# Patient Record
Sex: Male | Born: 1935 | Race: White | Hispanic: No | Marital: Single | Smoking: Former smoker
Health system: Southern US, Community
[De-identification: ages and names within clinical notes are randomized; demographics above are authoritative.]

## PROBLEM LIST (undated history)

## (undated) DIAGNOSIS — I2510 Atherosclerotic heart disease of native coronary artery without angina pectoris: Secondary | ICD-10-CM

## (undated) DIAGNOSIS — K56609 Unspecified intestinal obstruction, unspecified as to partial versus complete obstruction: Secondary | ICD-10-CM

## (undated) DIAGNOSIS — C801 Malignant (primary) neoplasm, unspecified: Secondary | ICD-10-CM

## (undated) DIAGNOSIS — I10 Essential (primary) hypertension: Secondary | ICD-10-CM

## (undated) DIAGNOSIS — J449 Chronic obstructive pulmonary disease, unspecified: Secondary | ICD-10-CM

## (undated) HISTORY — PX: HERNIA REPAIR: SHX51

## (undated) HISTORY — PX: COLOSTOMY: SHX63

## (undated) HISTORY — PX: CORONARY ANGIOPLASTY WITH STENT PLACEMENT: SHX49

## (undated) HISTORY — PX: CARDIAC SURGERY: SHX584

## (undated) HISTORY — PX: CHOLECYSTECTOMY: SHX55

---

## 2015-01-10 ENCOUNTER — Emergency Department (HOSPITAL_COMMUNITY)
Admission: EM | Admit: 2015-01-10 | Discharge: 2015-01-10 | Disposition: A | Discharge status: Home / Self Care | Payer: Medicare Other | Attending: Emergency Medicine | Admitting: Emergency Medicine

## 2015-01-10 ENCOUNTER — Emergency Department (HOSPITAL_COMMUNITY): Payer: Medicare Other

## 2015-01-10 ENCOUNTER — Encounter (HOSPITAL_COMMUNITY): Payer: Self-pay | Admitting: Emergency Medicine

## 2015-01-10 DIAGNOSIS — R112 Nausea with vomiting, unspecified: Secondary | ICD-10-CM | POA: Diagnosis present

## 2015-01-10 DIAGNOSIS — I2510 Atherosclerotic heart disease of native coronary artery without angina pectoris: Secondary | ICD-10-CM | POA: Insufficient documentation

## 2015-01-10 DIAGNOSIS — Z79899 Other long term (current) drug therapy: Secondary | ICD-10-CM | POA: Diagnosis not present

## 2015-01-10 DIAGNOSIS — Z7982 Long term (current) use of aspirin: Secondary | ICD-10-CM | POA: Diagnosis not present

## 2015-01-10 DIAGNOSIS — Z8719 Personal history of other diseases of the digestive system: Secondary | ICD-10-CM | POA: Insufficient documentation

## 2015-01-10 DIAGNOSIS — I10 Essential (primary) hypertension: Secondary | ICD-10-CM | POA: Insufficient documentation

## 2015-01-10 DIAGNOSIS — Z87891 Personal history of nicotine dependence: Secondary | ICD-10-CM | POA: Insufficient documentation

## 2015-01-10 DIAGNOSIS — R42 Dizziness and giddiness: Secondary | ICD-10-CM

## 2015-01-10 DIAGNOSIS — J449 Chronic obstructive pulmonary disease, unspecified: Secondary | ICD-10-CM | POA: Diagnosis not present

## 2015-01-10 DIAGNOSIS — Z955 Presence of coronary angioplasty implant and graft: Secondary | ICD-10-CM | POA: Insufficient documentation

## 2015-01-10 DIAGNOSIS — Z8551 Personal history of malignant neoplasm of bladder: Secondary | ICD-10-CM | POA: Insufficient documentation

## 2015-01-10 HISTORY — DX: Atherosclerotic heart disease of native coronary artery without angina pectoris: I25.10

## 2015-01-10 HISTORY — DX: Unspecified intestinal obstruction, unspecified as to partial versus complete obstruction: K56.609

## 2015-01-10 HISTORY — DX: Essential (primary) hypertension: I10

## 2015-01-10 HISTORY — DX: Malignant (primary) neoplasm, unspecified: C80.1

## 2015-01-10 HISTORY — DX: Chronic obstructive pulmonary disease, unspecified: J44.9

## 2015-01-10 LAB — CBC WITH DIFFERENTIAL/PLATELET
Basophils Absolute: 0 10*3/uL (ref 0.0–0.1)
Basophils Relative: 0 % (ref 0–1)
EOS ABS: 0.2 10*3/uL (ref 0.0–0.7)
Eosinophils Relative: 2 % (ref 0–5)
HCT: 45.1 % (ref 39.0–52.0)
Hemoglobin: 15.2 g/dL (ref 13.0–17.0)
LYMPHS ABS: 1.1 10*3/uL (ref 0.7–4.0)
LYMPHS PCT: 10 % — AB (ref 12–46)
MCH: 29.3 pg (ref 26.0–34.0)
MCHC: 33.7 g/dL (ref 30.0–36.0)
MCV: 87.1 fL (ref 78.0–100.0)
MONO ABS: 0.4 10*3/uL (ref 0.1–1.0)
MONOS PCT: 4 % (ref 3–12)
NEUTROS PCT: 84 % — AB (ref 43–77)
Neutro Abs: 9.7 10*3/uL — ABNORMAL HIGH (ref 1.7–7.7)
PLATELETS: 181 10*3/uL (ref 150–400)
RBC: 5.18 MIL/uL (ref 4.22–5.81)
RDW: 13.5 % (ref 11.5–15.5)
WBC: 11.5 10*3/uL — AB (ref 4.0–10.5)

## 2015-01-10 LAB — LIPASE, BLOOD: LIPASE: 32 U/L (ref 11–59)

## 2015-01-10 LAB — COMPREHENSIVE METABOLIC PANEL
ALT: 20 U/L (ref 0–53)
AST: 29 U/L (ref 0–37)
Albumin: 4.1 g/dL (ref 3.5–5.2)
Alkaline Phosphatase: 121 U/L — ABNORMAL HIGH (ref 39–117)
Anion gap: 9 (ref 5–15)
BILIRUBIN TOTAL: 0.8 mg/dL (ref 0.3–1.2)
BUN: 16 mg/dL (ref 6–23)
CALCIUM: 8.8 mg/dL (ref 8.4–10.5)
CHLORIDE: 105 mmol/L (ref 96–112)
CO2: 23 mmol/L (ref 19–32)
CREATININE: 1.03 mg/dL (ref 0.50–1.35)
GFR calc non Af Amer: 68 mL/min — ABNORMAL LOW (ref >90)
GFR, EST AFRICAN AMERICAN: 79 mL/min — AB (ref >90)
Glucose, Bld: 139 mg/dL — ABNORMAL HIGH (ref 70–99)
Potassium: 3.8 mmol/L (ref 3.5–5.1)
Sodium: 137 mmol/L (ref 135–145)
Total Protein: 6.9 g/dL (ref 6.0–8.3)

## 2015-01-10 LAB — I-STAT CG4 LACTIC ACID, ED: LACTIC ACID, VENOUS: 1.29 mmol/L (ref 0.5–2.0)

## 2015-01-10 LAB — I-STAT TROPONIN, ED: TROPONIN I, POC: 0 ng/mL (ref 0.00–0.08)

## 2015-01-10 MED ORDER — SODIUM CHLORIDE 0.9 % IV SOLN
1000.0000 mL | INTRAVENOUS | Status: DC
  Administered 2015-01-10: 1000 mL via INTRAVENOUS

## 2015-01-10 MED ORDER — LORAZEPAM 1 MG PO TABS: 1.0000 mg | ORAL_TABLET | Freq: Once | ORAL | Status: DC

## 2015-01-10 MED ORDER — ONDANSETRON 8 MG PO TBDP: ORAL_TABLET | ORAL | Status: AC

## 2015-01-10 MED ORDER — ONDANSETRON HCL 4 MG/2ML IJ SOLN
4.0000 mg | Freq: Once | INTRAMUSCULAR | Status: AC
  Administered 2015-01-10: 4 mg via INTRAVENOUS
  Filled 2015-01-10: qty 2

## 2015-01-10 NOTE — ED Notes (Signed)
Pt tolerating po fluids well, able to ambulate without difficulty

## 2015-01-10 NOTE — ED Provider Notes (Signed)
CSN: [id number]     Arrival date & time 01/10/15  1956 History   First MD Initiated Contact with Patient 01/10/15 1958     Chief Complaint  Patient presents with  . Nausea     (Consider location/radiation/quality/duration/timing/severity/associated sxs/prior Treatment) The history is provided by the patient and medical records. No language interpreter was used.     Christopher Valentine is a 79 y.o. male  with a hx of HTN, vertigo, CAD presents to the Emergency Department complaining of intermittent dizziness onset 3 days ago with associated nausea and vomiting. Pt reports he usually vomits when he has vertigo symptoms, but it became extreme around 4pm with persistent vomiting and nausea since that time.  Pt denies fever, chills, abd pain, diarrhea, weakness.  He also denies headache, neck pain, chest pain, SOB, diarrhea, syncope, dysuria.  He reports using scopolamine patches and is wearing one now which has helped minimally.   Nothing seems to make his symptoms worse.  Pt reports he just moved from [place] and had a neg stress test and echo before moving.     Past Medical History  Diagnosis Date  . Hypertension   . Coronary artery disease   . Bowel obstruction     with colostomy and subsequent reanastamosis  . Cancer     bladder  . COPD (chronic obstructive pulmonary disease)    Past Surgical History  Procedure Laterality Date  . Cardiac surgery  cabg x 2  . Coronary angioplasty with stent placement    . Cholecystectomy    . Hernia repair    . Colostomy      with later reversal   History reviewed. No pertinent family history. History  Substance Use Topics  . Smoking status: Former Smoker -- 1.00 packs/day for 30 years    Types: Cigarettes  . Smokeless tobacco: Not on file  . Alcohol Use: No    Review of Systems  Constitutional: Negative for fever, diaphoresis, appetite change, fatigue and unexpected weight change.  HENT: Negative for mouth sores.   Eyes: Negative for visual  disturbance.  Respiratory: Negative for cough, chest tightness, shortness of breath and wheezing.   Cardiovascular: Negative for chest pain.  Gastrointestinal: Positive for nausea and vomiting. Negative for abdominal pain, diarrhea and constipation.  Endocrine: Negative for polydipsia, polyphagia and polyuria.  Genitourinary: Negative for dysuria, urgency, frequency and hematuria.  Musculoskeletal: Negative for back pain and neck stiffness.  Skin: Negative for rash.  Allergic/Immunologic: Negative for immunocompromised state.  Neurological: Positive for dizziness. Negative for syncope, light-headedness and headaches.  Hematological: Does not bruise/bleed easily.  Psychiatric/Behavioral: Negative for sleep disturbance. The patient is not nervous/anxious.       Allergies  Contrast media  Home Medications   Prior to Admission medications   Medication Sig Start Date End Date Taking? Authorizing Provider  albuterol (PROVENTIL HFA;VENTOLIN HFA) 108 (90 BASE) MCG/ACT inhaler Inhale 1 puff into the lungs every 6 (six) hours as needed for wheezing or shortness of breath.   Yes Historical Provider, MD  amLODipine (NORVASC) 10 MG tablet Take 5 mg by mouth daily.   Yes Historical Provider, MD  aspirin 81 MG chewable tablet Chew 81 mg by mouth daily.   Yes Historical Provider, MD  carvedilol (COREG) 12.5 MG tablet Take 12.5 mg by mouth 2 (two) times daily with a meal.   Yes Historical Provider, MD  docusate sodium (COLACE) 100 MG capsule Take 100 mg by mouth at bedtime.   Yes Historical Provider, MD  finasteride (PROSCAR) 5 MG tablet Take 5 mg by mouth daily.   Yes Historical Provider, MD  losartan (COZAAR) 100 MG tablet Take 100 mg by mouth daily.   Yes Historical Provider, MD  meclizine (ANTIVERT) 25 MG tablet Take 25 mg by mouth 3 (three) times daily.   Yes Historical Provider, MD  ondansetron (ZOFRAN ODT) 8 MG disintegrating tablet 8mg  ODT q4 hours prn nausea 01/10/15   Jarrett Soho Quisha Mabie, PA-C   pantoprazole (PROTONIX) 40 MG tablet Take 40 mg by mouth 2 (two) times daily before a meal.   Yes Historical Provider, MD  PARoxetine (PAXIL) 30 MG tablet Take 15 mg by mouth daily.   Yes Historical Provider, MD  rosuvastatin (CRESTOR) 40 MG tablet Take 20 mg by mouth at bedtime.   Yes Historical Provider, MD  scopolamine (TRANSDERM-SCOP) 1 MG/3DAYS Place 1 patch onto the skin as needed (for dizziness).    Yes Historical Provider, MD  tamsulosin (FLOMAX) 0.4 MG CAPS capsule Take 0.4 mg by mouth daily.   Yes Historical Provider, MD  Tiotropium Bromide Monohydrate 2.5 MCG/ACT AERS Inhale 1 puff into the lungs daily.   Yes Historical Provider, MD   BP 159/71 mmHg  Pulse 110  Temp(Src) 97.7 F (36.5 C) (Oral)  Resp 22  Ht 5\' 7"  (1.702 m)  Wt 245 lb (111.131 kg)  BMI 38.36 kg/m2  SpO2 96% Physical Exam  Constitutional: He is oriented to person, place, and time. He appears well-developed and well-nourished. No distress.  HENT:  Head: Normocephalic and atraumatic.  Mouth/Throat: Oropharynx is clear and moist.  Eyes: Conjunctivae and EOM are normal. Pupils are equal, round, and reactive to light. No scleral icterus.  No horizontal, vertical or rotational nystagmus  Neck: Normal range of motion. Neck supple.  Full active and passive ROM without pain No midline or paraspinal tenderness No nuchal rigidity or meningeal signs  Cardiovascular: Normal rate, regular rhythm, normal heart sounds and intact distal pulses.   No murmur heard. Pulmonary/Chest: Effort normal and breath sounds normal. No respiratory distress. He has no wheezes. He has no rales.  Abdominal: Soft. Bowel sounds are normal. There is no tenderness. There is no rebound and no guarding.  Musculoskeletal: Normal range of motion.  Lymphadenopathy:    He has no cervical adenopathy.  Neurological: He is alert and oriented to person, place, and time. He has normal reflexes. No cranial nerve deficit. He exhibits normal muscle tone.  Coordination normal.  Mental Status:  Alert, oriented, thought content appropriate. Speech fluent without evidence of aphasia. Able to follow 2 step commands without difficulty.  Cranial Nerves:  II:  Peripheral visual fields grossly normal, pupils equal, round, reactive to light III,IV, VI: ptosis not present, extra-ocular motions intact bilaterally  V,VII: smile symmetric, facial light touch sensation equal VIII: hearing grossly normal bilaterally  IX,X: gag reflex present  XI: bilateral shoulder shrug equal and strong XII: midline tongue extension  Motor:  5/5 in upper and lower extremities bilaterally including strong and equal grip strength and dorsiflexion/plantar flexion Sensory: Pinprick and light touch normal in all extremities.  Deep Tendon Reflexes: 2+ and symmetric  Cerebellar: normal finger-to-nose with bilateral upper extremities Gait: gait testing deferred CV: distal pulses palpable throughout   Skin: Skin is warm and dry. No rash noted. He is not diaphoretic.  Psychiatric: He has a normal mood and affect. His behavior is normal. Judgment and thought content normal.  Nursing note and vitals reviewed.   ED Course  Procedures (including critical care time)  Labs Review Labs Reviewed  CBC WITH DIFFERENTIAL/PLATELET - Abnormal; Notable for the following:    WBC 11.5 (*)    Neutrophils Relative % 84 (*)    Neutro Abs 9.7 (*)    Lymphocytes Relative 10 (*)    All other components within normal limits  COMPREHENSIVE METABOLIC PANEL - Abnormal; Notable for the following:    Glucose, Bld 139 (*)    Alkaline Phosphatase 121 (*)    GFR calc non Af Amer 68 (*)    GFR calc Af Amer 79 (*)    All other components within normal limits  LIPASE, BLOOD  URINALYSIS, ROUTINE W REFLEX MICROSCOPIC  I-STAT CG4 LACTIC ACID, ED  I-STAT TROPOININ, ED    Imaging Review Ct Head Wo Contrast  01/10/2015   CLINICAL DATA:  Nausea and vomiting for 5 hr.  Blurry vision.  EXAM: CT HEAD  WITHOUT CONTRAST  TECHNIQUE: Contiguous axial images were obtained from the base of the skull through the vertex without intravenous contrast.  COMPARISON:  None.  FINDINGS: No intracranial hemorrhage, mass effect, or midline shift. No hydrocephalus. The basilar cisterns are patent. No evidence of territorial infarct. No intracranial fluid collection. Mild cerebral volume loss, normal for age. There is a small parenchymal right temporal calcification. Calvarium is intact. Included paranasal sinuses and mastoid air cells are well aerated.  IMPRESSION: No acute intracranial abnormality.   Electronically Signed   By: Jeb Levering M.D.   On: 01/10/2015 21:18   Mr Brain Wo Contrast  01/10/2015   CLINICAL DATA:  Nausea, vomiting beginning this afternoon. Dizziness and blurry vision.  EXAM: MRI HEAD WITHOUT CONTRAST  TECHNIQUE: Multiplanar, multiecho pulse sequences of the brain and surrounding structures were obtained without intravenous contrast.  COMPARISON:  CT of the head January 10, 2015 at 2103 hr  FINDINGS: Mild motion degraded examination. No reduced diffusion to suggest acute ischemia. No susceptibility artifact to suggest hemorrhage.  Ventricles and sulci are normal for patient's age. A few scattered tiny remote cerebellar infarcts. Patchy to confluent supratentorial and pontine white matter T2 hyperintensities most consistent with chronic small vessel ischemic disease. Tiny T2 hyperintensities in the basal ganglia and thalamus favor predominant perivascular spaces with small probable superimposed remote lacunar infarcts. No midline shift, mass effect or mass lesions.  Ectatic RIGHT cavernous carotid artery, with a robust RIGHT posterior communicating artery flow void noted, major intracranial vascular flow voids observed at the skull base.  Mild ethmoid mucosal thickening, no paranasal sinus air-fluid levels. Trace RIGHT mastoid effusion. Ocular globes and orbital contents are unremarkable though not  tailored for evaluation. No abnormal sellar expansion. No cerebellar tonsillar ectopia. Patient is edentulous.  IMPRESSION: No acute intracranial process, specifically no acute ischemia or specific findings to explain new onset vertigo.  Trace RIGHT mastoid effusion.  Involutional changes. Moderate white matter changes can be seen with chronic small vessel ischemic disease. Tiny remote cerebellar and probable basal ganglia and thalamus lacunar infarcts.   Electronically Signed   By: Elon Alas   On: 01/10/2015 22:58     EKG Interpretation None      MDM   Final diagnoses:  Vertigo  Non-intractable vomiting with nausea, vomiting of unspecified type    Norlene Campbell presents with vertigo symptoms.  Pt reports history of same but reports that today's symptoms are slightly different. Patient with normal neurologic exam, no nystagmus. Will obtain labs and CT scan.  No emesis here.  9:30 PM CT without acute intracranial abnormality, will obtain MRI.  11:35 PM MRI with no acute intracranial process, stable in no acute ischemic findings. Patient given Ativan for anxiety reflexes during MRI. At this time patient reports complete resolution of his symptoms and reports he feels well. Wellbutrin trial and ambulate here in the emergency department with plan for discharge home.  11:45 PM Pt walks without gait disturbance.  He is tolerating PO without difficulty.    I have personally reviewed patient's vitals, nursing note and any pertinent labs or imaging.  I performed an undressed physical exam.    It has been determined that no acute conditions requiring further emergency intervention are present at this time. The patient/guardian have been advised of the diagnosis and plan. I reviewed all labs and imaging including any potential incidental findings. We have discussed signs and symptoms that warrant return to the ED and they are listed in the discharge instructions.    Vital signs are stable at  discharge.   BP 159/71 mmHg  Pulse 110  Temp(Src) 97.7 F (36.5 C) (Oral)  Resp 22  Ht 5\' 7"  (1.702 m)  Wt 245 lb (111.131 kg)  BMI 38.36 kg/m2  SpO2 96%   The patient was discussed with and seen by Dr. Ralene Bathe who agrees with the treatment plan.   Jarrett Soho Thara Searing, PA-C 01/10/15 [id number]  Quintella Reichert, MD 01/10/15 [phone]

## 2015-01-10 NOTE — ED Notes (Signed)
Per ems, pt w/nausea and vomiting since 4pm today. Pt arrives awake, alert, oriented, c/o nausea, dizziness and blurry vision

## 2015-01-10 NOTE — Discharge Instructions (Signed)
1. Medications: usual home medications 2. Treatment: rest, drink plenty of fluids,  3. Follow Up: Please followup with your primary doctor in 3 days for discussion of your diagnoses and further evaluation after today's visit; if you do not have a primary care doctor use the resource guide provided to find one; Please return to the ER for return of symptoms, intractable vomiting or worsening of vertigo symptoms    Benign Positional Vertigo Vertigo means you feel like you or your surroundings are moving when they are not. Benign positional vertigo is the most common form of vertigo. Benign means that the cause of your condition is not serious. Benign positional vertigo is more common in older adults. CAUSES  Benign positional vertigo is the result of an upset in the labyrinth system. This is an area in the middle ear that helps control your balance. This may be caused by a viral infection, head injury, or repetitive motion. However, often no specific cause is found. SYMPTOMS  Symptoms of benign positional vertigo occur when you move your head or eyes in different directions. Some of the symptoms may include:  Loss of balance and falls.  Vomiting.  Blurred vision.  Dizziness.  Nausea.  Involuntary eye movements (nystagmus). DIAGNOSIS  Benign positional vertigo is usually diagnosed by physical exam. If the specific cause of your benign positional vertigo is unknown, your caregiver may perform imaging tests, such as magnetic resonance imaging (MRI) or computed tomography (CT). TREATMENT  Your caregiver may recommend movements or procedures to correct the benign positional vertigo. Medicines such as meclizine, benzodiazepines, and medicines for nausea may be used to treat your symptoms. In rare cases, if your symptoms are caused by certain conditions that affect the inner ear, you may need surgery. HOME CARE INSTRUCTIONS   Follow your caregiver's instructions.  Move slowly. Do not make  sudden body or head movements.  Avoid driving.  Avoid operating heavy machinery.  Avoid performing any tasks that would be dangerous to you or others during a vertigo episode.  Drink enough fluids to keep your urine clear or pale yellow. SEEK IMMEDIATE MEDICAL CARE IF:   You develop problems with walking, weakness, numbness, or using your arms, hands, or legs.  You have difficulty speaking.  You develop severe headaches.  Your nausea or vomiting continues or gets worse.  You develop visual changes.  Your family or friends notice any behavioral changes.  Your condition gets worse.  You have a fever.  You develop a stiff neck or sensitivity to light. MAKE SURE YOU:   Understand these instructions.  Will watch your condition.  Will get help right away if you are not doing well or get worse. Document Released: 09/01/2006 Document Revised: 02/16/2012 Document Reviewed: 08/14/2011 [facility] Patient Information 2015 [place], [place]. This information is not intended to replace advice given to you by your health care provider. Make sure you discuss any questions you have with your health care provider.

## 2016-01-28 IMAGING — MR MR HEAD W/O CM
8 of 10 series · 35 of 48 positions shown · non-contrast
Comparison: CT of the head January 10, 2015 at 6394 hr

CLINICAL DATA: Nausea, vomiting beginning this afternoon. Dizziness
and blurry vision.

EXAM:
MRI HEAD WITHOUT CONTRAST
TECHNIQUE: Multiplanar, multiecho pulse sequences of the brain and surrounding
structures were obtained without intravenous contrast.

[Series 3: T1 · sagittal · 5.0mm · 0.47mm/px · 2 of 25 slices shown]
[im 1/25]
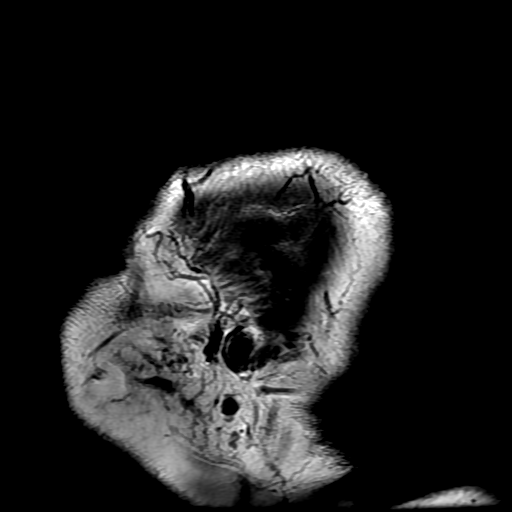
[im 25/25]
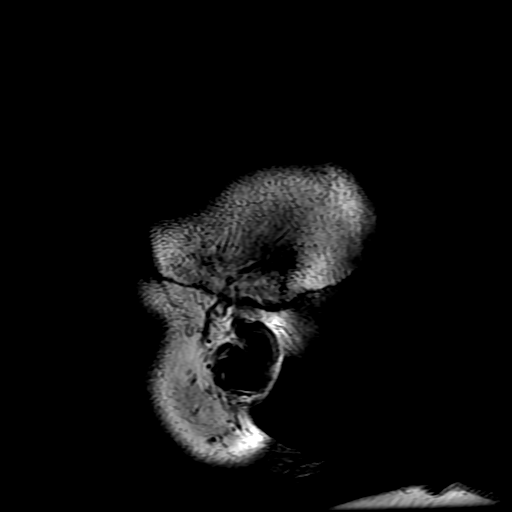

[Series 4: DWI · axial · 3.0mm · 1.09mm/px · z∈[-58,+71]mm · 9 of 92 slices shown (1 of 4)]
[im 1/92]
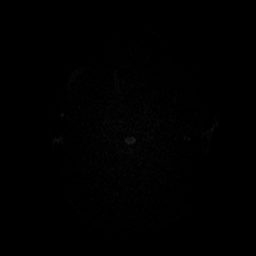
[im 12/92]
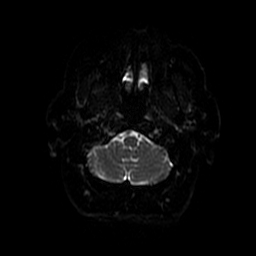
[im 23/92]
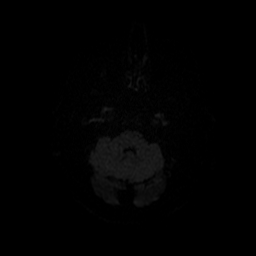
[im 35/92]
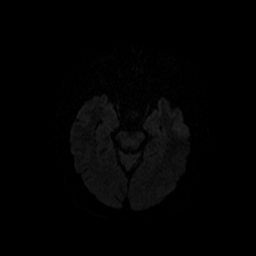
[im 46/92]
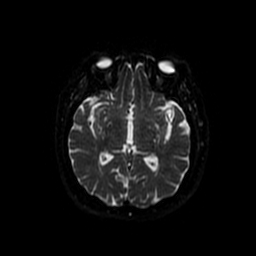
[im 57/92]
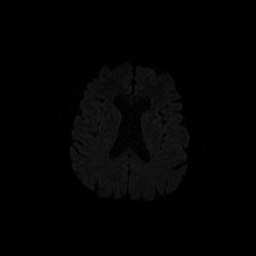
[im 69/92]
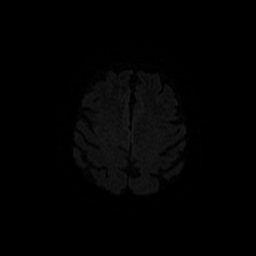
[im 80/92]
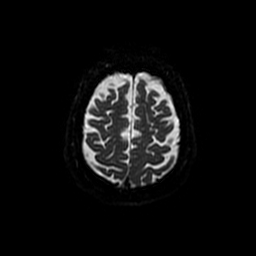
[im 92/92]
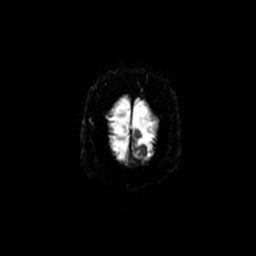

[Series 5: T2 · axial · 5.0mm · 0.43mm/px · z∈[-59,+82]mm · 3 of 26 slices shown (1 of 2)]
[im 1/26]
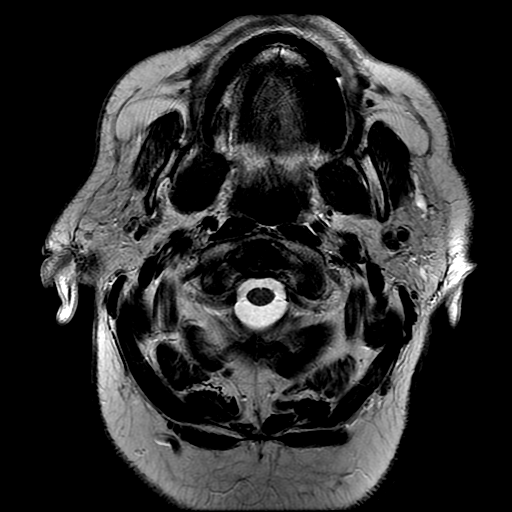
[im 13/26]
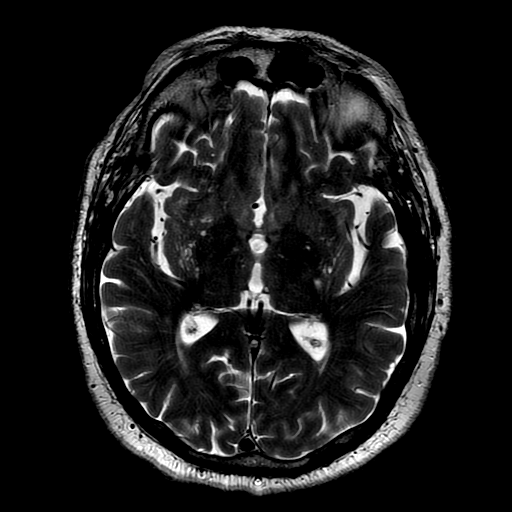
[im 26/26]
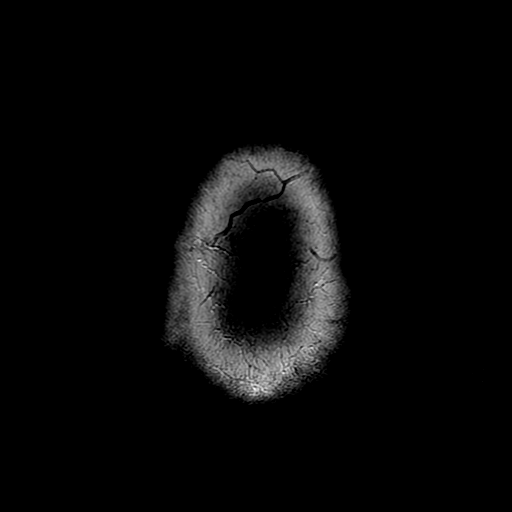

[Series 6: DWI · coronal · 5.0mm · 1.09mm/px · 7 of 66 slices shown (2 of 4)]
[im 1/66]
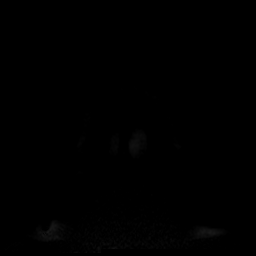
[im 11/66]
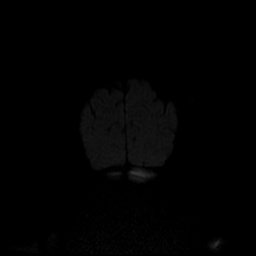
[im 22/66]
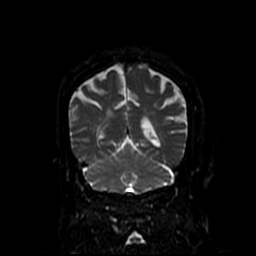
[im 33/66]
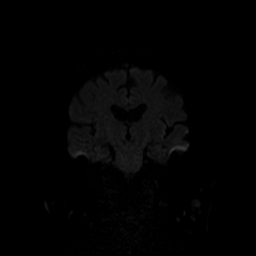
[im 44/66]
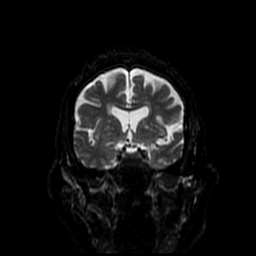
[im 55/66]
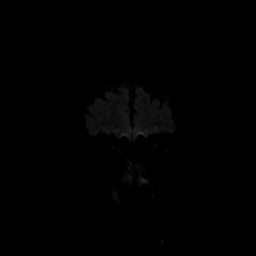
[im 66/66]
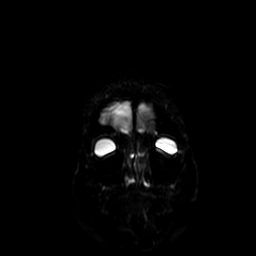

[Series 7: FLAIR · axial · 5.0mm · 0.43mm/px · z∈[-59,+82]mm · 3 of 26 slices shown]
[im 1/26]
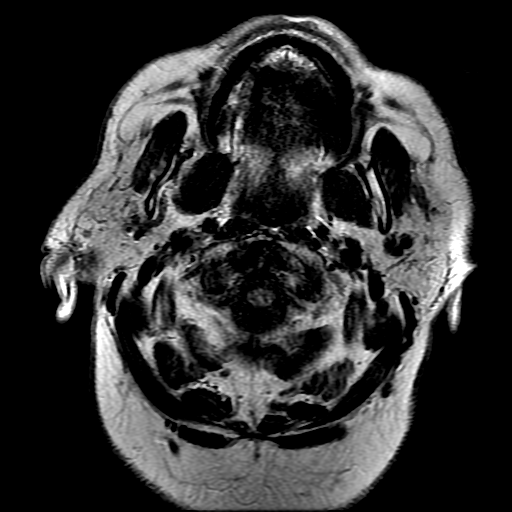
[im 13/26]
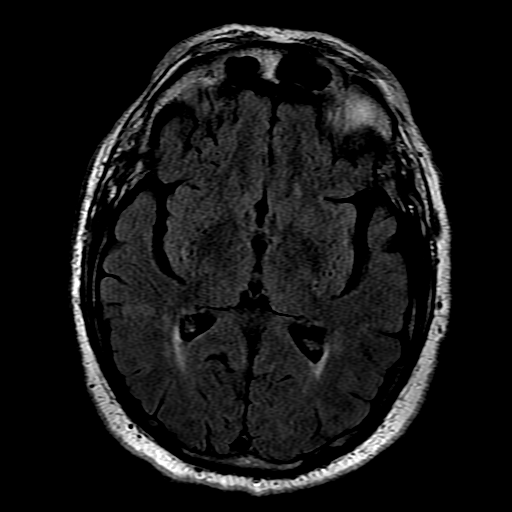
[im 26/26]
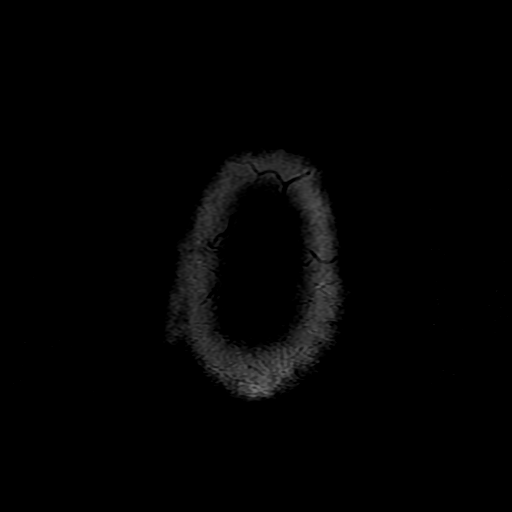

[Series 11: T2 · coronal · 5.0mm · 0.43mm/px · 3 of 30 slices shown (2 of 2)]
[im 1/30]
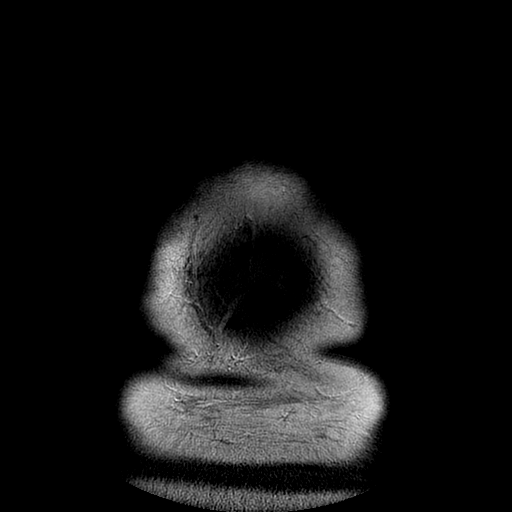
[im 15/30]
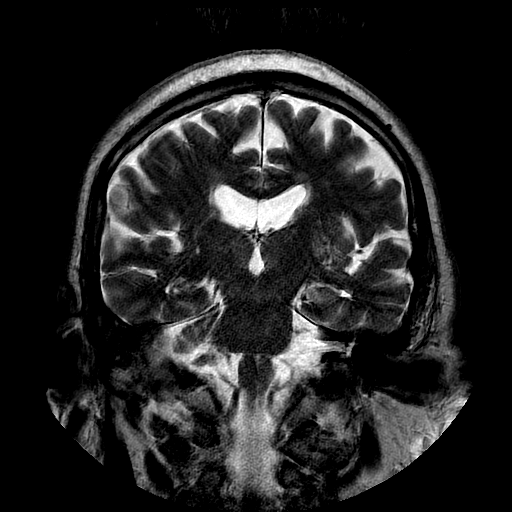
[im 30/30]
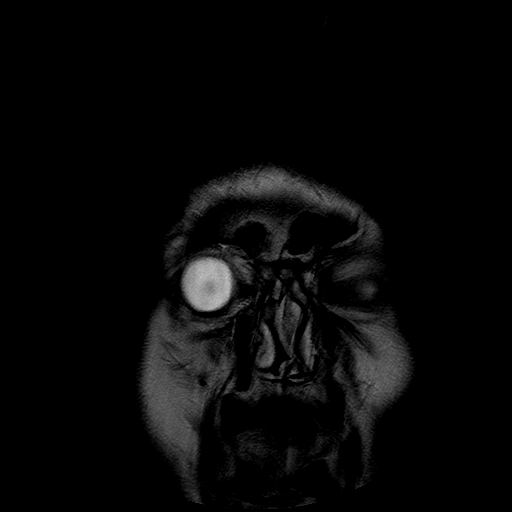

[Series 400: DWI · axial · 3.0mm · 1.09mm/px · z∈[-58,+71]mm · 5 of 46 slices shown (3 of 4)]
[im 1/46]
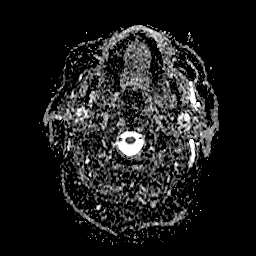
[im 12/46]
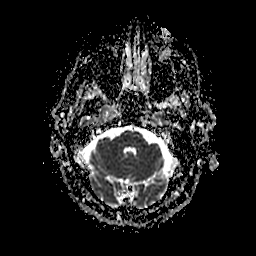
[im 23/46]
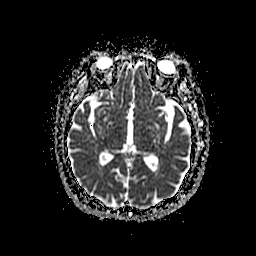
[im 34/46]
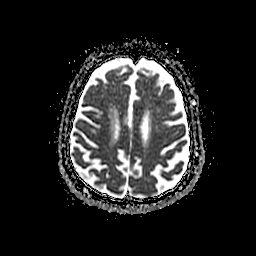
[im 46/46]
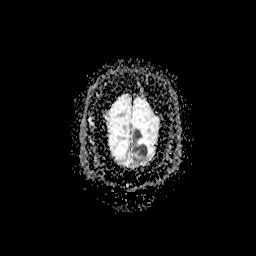

[Series 600: DWI · coronal · 5.0mm · 1.09mm/px · 3 of 33 slices shown (4 of 4)]
[im 1/33]
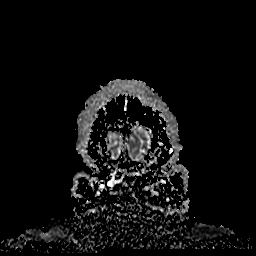
[im 17/33]
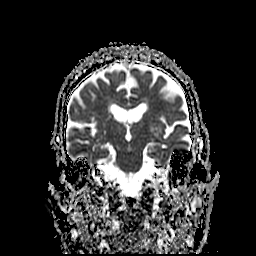
[im 33/33]
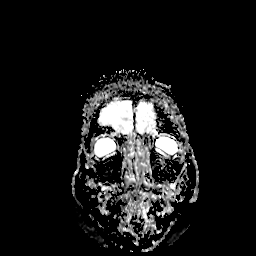

[35 of 48 positions shown; findings below may reference images not displayed]

FINDINGS: Mild motion degraded examination. No reduced diffusion to suggest
acute ischemia. No susceptibility artifact to suggest hemorrhage.

Ventricles and sulci are normal for patient's age. A few scattered
tiny remote cerebellar infarcts. Patchy to confluent supratentorial
and pontine white matter T2 hyperintensities most consistent with
chronic small vessel ischemic disease. Tiny T2 hyperintensities in
the basal ganglia and thalamus favor predominant perivascular spaces
with small probable superimposed remote lacunar infarcts. No midline
shift, mass effect or mass lesions.

Ectatic RIGHT cavernous carotid artery, with a robust RIGHT
posterior communicating artery flow void noted, major intracranial
vascular flow voids observed at the skull base.

Mild ethmoid mucosal thickening, no paranasal sinus air-fluid
levels. Trace RIGHT mastoid effusion. Ocular globes and orbital
contents are unremarkable though not tailored for evaluation. No
abnormal sellar expansion. No cerebellar tonsillar ectopia. Patient
is edentulous.
IMPRESSION: No acute intracranial process, specifically no acute ischemia or
specific findings to explain new onset vertigo.

Trace RIGHT mastoid effusion.

Involutional changes. Moderate white matter changes can be seen with
chronic small vessel ischemic disease. Tiny remote cerebellar and
probable basal ganglia and thalamus lacunar infarcts.

  By: Alia Tiger
